# Patient Record
Sex: Male | Born: 1955 | Race: White | Hispanic: No | Marital: Single | Smoking: Never smoker
Health system: Southern US, Community
[De-identification: ages and names within clinical notes are randomized; demographics above are authoritative.]

## PROBLEM LIST (undated history)

## (undated) HISTORY — PX: KIDNEY SURGERY: SHX687

---

## 2014-07-12 ENCOUNTER — Encounter (HOSPITAL_COMMUNITY): Payer: Self-pay | Admitting: Emergency Medicine

## 2014-07-12 ENCOUNTER — Emergency Department (HOSPITAL_COMMUNITY)
Admission: EM | Admit: 2014-07-12 | Discharge: 2014-07-12 | Disposition: A | Discharge status: Home / Self Care | Payer: Self-pay | Attending: Emergency Medicine | Admitting: Emergency Medicine

## 2014-07-12 ENCOUNTER — Emergency Department (HOSPITAL_COMMUNITY): Payer: Self-pay

## 2014-07-12 DIAGNOSIS — X503XXA Overexertion from repetitive movements, initial encounter: Secondary | ICD-10-CM | POA: Insufficient documentation

## 2014-07-12 DIAGNOSIS — M1710 Unilateral primary osteoarthritis, unspecified knee: Secondary | ICD-10-CM | POA: Insufficient documentation

## 2014-07-12 DIAGNOSIS — S8990XA Unspecified injury of unspecified lower leg, initial encounter: Secondary | ICD-10-CM | POA: Insufficient documentation

## 2014-07-12 DIAGNOSIS — Y9289 Other specified places as the place of occurrence of the external cause: Secondary | ICD-10-CM | POA: Insufficient documentation

## 2014-07-12 DIAGNOSIS — S99929A Unspecified injury of unspecified foot, initial encounter: Principal | ICD-10-CM

## 2014-07-12 DIAGNOSIS — Y9389 Activity, other specified: Secondary | ICD-10-CM | POA: Insufficient documentation

## 2014-07-12 DIAGNOSIS — S99919A Unspecified injury of unspecified ankle, initial encounter: Principal | ICD-10-CM

## 2014-07-12 DIAGNOSIS — M1711 Unilateral primary osteoarthritis, right knee: Secondary | ICD-10-CM

## 2014-07-12 MED ORDER — PROMETHAZINE HCL 12.5 MG PO TABS
25.0000 mg | ORAL_TABLET | Freq: Once | ORAL | Status: AC
  Administered 2014-07-12: 25 mg via ORAL
  Filled 2014-07-12: qty 2

## 2014-07-12 MED ORDER — PREDNISONE 50 MG PO TABS: 60.0000 mg | ORAL_TABLET | Freq: Once | ORAL | Status: DC

## 2014-07-12 MED ORDER — DEXAMETHASONE 4 MG PO TABS: ORAL_TABLET | ORAL | Status: AC

## 2014-07-12 MED ORDER — PROMETHAZINE HCL 12.5 MG PO TABS: 12.5000 mg | ORAL_TABLET | Freq: Four times a day (QID) | ORAL | Status: AC | PRN

## 2014-07-12 MED ORDER — DICLOFENAC SODIUM 75 MG PO TBEC: 75.0000 mg | DELAYED_RELEASE_TABLET | Freq: Two times a day (BID) | ORAL | Status: AC

## 2014-07-12 MED ORDER — ONDANSETRON 4 MG PO TBDP: 4.0000 mg | ORAL_TABLET | Freq: Once | ORAL | Status: DC

## 2014-07-12 MED ORDER — KETOROLAC TROMETHAMINE 60 MG/2ML IM SOLN
60.0000 mg | Freq: Once | INTRAMUSCULAR | Status: AC
  Administered 2014-07-12: 60 mg via INTRAMUSCULAR
  Filled 2014-07-12: qty 2

## 2014-07-12 MED ORDER — KETOROLAC TROMETHAMINE 10 MG PO TABS: 10.0000 mg | ORAL_TABLET | Freq: Once | ORAL | Status: DC

## 2014-07-12 MED ORDER — DEXAMETHASONE SODIUM PHOSPHATE 4 MG/ML IJ SOLN
8.0000 mg | Freq: Once | INTRAMUSCULAR | Status: AC
  Administered 2014-07-12: 8 mg via INTRAMUSCULAR
  Filled 2014-07-12: qty 2

## 2014-07-12 NOTE — ED Notes (Signed)
Patient with no complaints at this time. Respirations even and unlabored. Skin warm/dry. Discharge instructions reviewed with patient at this time. Patient given opportunity to voice concerns/ask questions. Patient discharged at this time and left Emergency Department with steady gait.   

## 2014-07-12 NOTE — ED Notes (Signed)
Pt states that he was lifting a push lawnmower to show his father something with the lawnmower when his right knee twisted a week ago,  pt has swelling to right knee area, cms intact distal, has tried aleve, tylenol and ibuprofen with no improvement in symptoms,

## 2014-07-12 NOTE — ED Provider Notes (Signed)
CSN: [id number]     Arrival date & time 07/12/14  1650 History   First MD Initiated Contact with Patient 07/12/14 1700     Chief Complaint  Patient presents with  . Knee Pain     (Consider location/radiation/quality/duration/timing/severity/associated sxs/prior Treatment) HPI Comments: Patient is a 58 year old male who presents to the emergency department with a complaint of right knee pain. The patient states that a week ago he was lifting a push lawnmower, when his weight shifted and he injured the right knee. The patient noted swelling and pain. He did not go to see a Dr. at that time. He use conservative measures including ice, heat, Tylenol, Aleve, and ibuprofen. The patient states that this problem is not getting any better. He has restarted work, where he is walking on concrete floors and using sterile toe boots on. He presents at this time for assistance with pain of his knee, and to have his knee evaluated.  Patient is a 58 y.o. male presenting with knee pain. The history is provided by the patient.  Knee Pain Associated symptoms: no back pain and no neck pain     History reviewed. No pertinent past medical history. Past Surgical History  Procedure Laterality Date  . 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 History  Substance Use Topics  . Smoking status: Never Smoker   . Smokeless tobacco: Not on file  . Alcohol Use: No    Review of Systems  Constitutional: Negative for activity change.       All ROS Neg except as noted in HPI  HENT: Negative for nosebleeds.   Eyes: Negative for photophobia and discharge.  Respiratory: Negative for cough, shortness of breath and wheezing.   Cardiovascular: Negative for chest pain and palpitations.  Gastrointestinal: Negative for abdominal pain and blood in stool.  Genitourinary: Negative for dysuria, frequency and hematuria.  Musculoskeletal: Positive for arthralgias. Negative for back pain  and neck pain.  Skin: Negative.   Neurological: Negative for dizziness, seizures and speech difficulty.  Psychiatric/Behavioral: Negative for hallucinations and confusion.      Allergies  Review of patient's allergies indicates no known allergies.  Home Medications   Prior to Admission medications   Not on File   BP 144/98  Pulse 85  Temp(Src) 98.9 F (37.2 C)  Resp 16  Ht 6\' 1"  (1.854 m)  Wt 238 lb (107.956 kg)  BMI 31.41 kg/m2  SpO2 98% Physical Exam  Nursing note and vitals reviewed. Constitutional: He is oriented to person, place, and time. He appears well-developed and well-nourished.  Non-toxic appearance.  HENT:  Head: Normocephalic.  Right Ear: Tympanic membrane and external ear normal.  Left Ear: Tympanic membrane and external ear normal.  Eyes: EOM and lids are normal. Pupils are equal, round, and reactive to light.  Neck: Normal range of motion. Neck supple. Carotid bruit is not present.  Cardiovascular: Normal rate, regular rhythm, normal heart sounds, intact distal pulses and normal pulses.   Pulmonary/Chest: Breath sounds normal. No respiratory distress.  Abdominal: Soft. Bowel sounds are normal. There is no tenderness. There is no guarding.  Musculoskeletal: Normal range of motion.  There is good range of motion of the right hip. There is a mild to moderate effusion of the right knee. There is pain to palpation of the posterior knee, I cannot demonstrate a mass. There is no deformity of the quadricep or the anterior tibial tuberosity.  There is crepitus with attempted range of motion. The dorsalis pedis and posterior tibial pulses are 2+. Capillary refill is less than 2 seconds.  Lymphadenopathy:       Head (right side): No submandibular adenopathy present.       Head (left side): No submandibular adenopathy present.    He has no cervical adenopathy.  Neurological: He is alert and oriented to person, place, and time. He has normal strength. No cranial nerve  deficit or sensory deficit.  Skin: Skin is warm and dry.  Psychiatric: He has a normal mood and affect. His speech is normal.    ED Course  Procedures (including critical care time) Labs Review Labs Reviewed - No data to display  Imaging Review No results found.   EKG Interpretation None      MDM Patient presents to the emergency department with complaint of knee pain after lifting a push lawnmower. Differential diagnosis at this time includes ligament injury, occult fracture, knee strain/sprain, dislocation, exacerbation of previous degenerative joint disease changes.  Vital signs within normal limits with exception of the blood pressure being elevated at 144/98. Pulse oximetry is 98% on room air. Within normal limits by my interpretation. X-ray of the right knee reveals mild to moderate tricompartment degenerative changes with chondrocalcinosis. No fractures appreciated. No dislocation noted.  Patient is fitted with a knee immobilizer. Crutches were offered but declined. The patient is placed on Decadron, Voltaren, and promethazine, as he has problems with nausea when taking NSAIDs. The patient is referred to orthopedics, and strongly encouraged to see the orthopedic specialist for additional evaluation and management of his knees. Patient acknowledges understanding of this discharge instructions.    Final diagnoses:  None    **I have reviewed nursing notes, vital signs, and all appropriate lab and imaging results for this patient.Kathie Dike*    Leeanne Butters M Tashan Kreitzer, PA-C 07/15/14 [phone]

## 2014-07-12 NOTE — Discharge Instructions (Signed)
Your x-ray is negative for fracture or dislocation. Your x-ray does show SEVERE degenerative changes (arthritis) of your knee. You have fluid also on your knee as a result of the arthritis. There is some question of a ligament strain present on your examination. Please use the knee immobilizer over the next 7-10 days. Please see an orthopedic specialist as sone as possible. Please use medications as suggested. Promethazine for the nausea may cause drowsiness, please use with caution. Arthritis, Nonspecific Arthritis is pain, redness, warmth, or puffiness (inflammation) of a joint. The joint may be stiff or hurt when you move it. One or more joints may be affected. There are many types of arthritis. Your doctor may not know what type you have right away. The most common cause of arthritis is wear and tear on the joint (osteoarthritis). HOME CARE   Only take medicine as told by your doctor.  Rest the joint as much as possible.  Raise (elevate) your joint if it is puffy.  Use crutches if the painful joint is in your leg.  Drink enough fluids to keep your pee (urine) clear or pale yellow.  Follow your doctor's diet instructions.  Use cold packs for very bad joint pain for 10 to 15 minutes every hour. Ask your doctor if it is okay for you to use hot packs.  Exercise as told by your doctor.  Take a warm shower if you have stiffness in the morning.  Move your sore joints throughout the day. GET HELP RIGHT AWAY IF:   You have a fever.  You have very bad joint pain, puffiness, or redness.  You have many joints that are painful and puffy.  You are not getting better with treatment.  You have very bad back pain or leg weakness.  You cannot control when you poop (bowel movement) or pee (urinate).  You do not feel better in 24 hours or are getting worse.  You are having side effects from your medicine. MAKE SURE YOU:   Understand these instructions.  Will watch your condition.  Will  get help right away if you are not doing well or get worse. Document Released: 02/15/2010 Document Revised: 05/22/2012 Document Reviewed: 02/15/2010 [facility] Patient Information 2015 [place], [place]. This information is not intended to replace advice given to you by your health care provider. Make sure you discuss any questions you have with your health care provider.

## 2014-07-17 NOTE — ED Provider Notes (Signed)
Medical screening examination/treatment/procedure(s) were performed by non-physician practitioner and as supervising physician I was immediately available for consultation/collaboration.   EKG Interpretation None        Jeremi Losito, MD 07/17/14 0403 

## 2014-12-20 IMAGING — CR DG KNEE COMPLETE 4+V*R*
4 series · 4 of 4 positions shown · non-contrast
Comparison: None.

CLINICAL DATA: Knee pain status post twisting injury

EXAM:
RIGHT KNEE - COMPLETE 4+ VIEW

[view not recorded (1 of 4)]
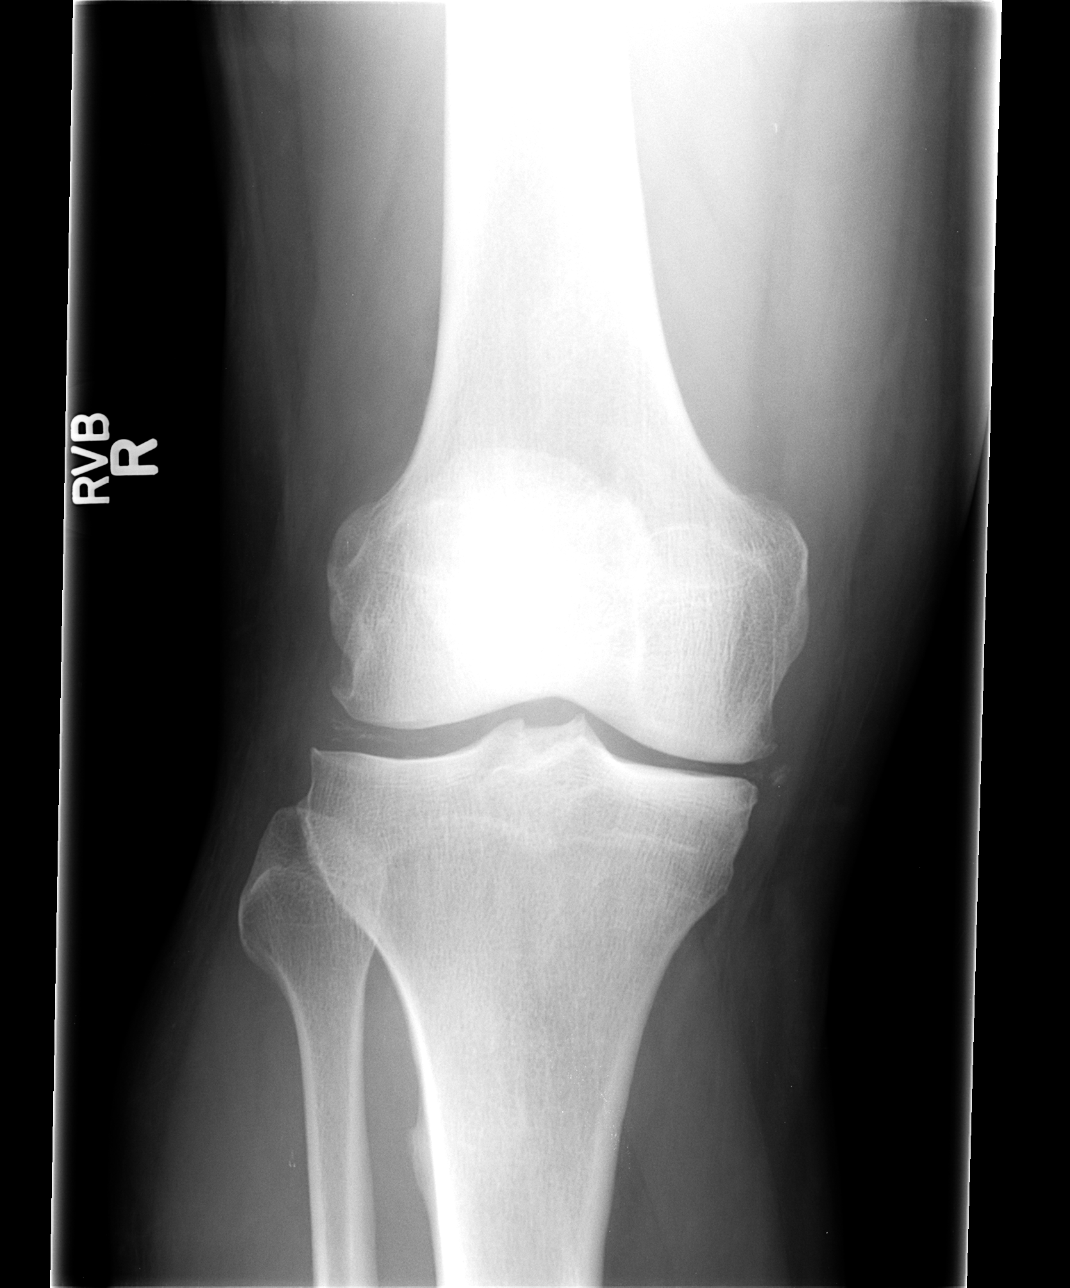

[view not recorded (2 of 4)]
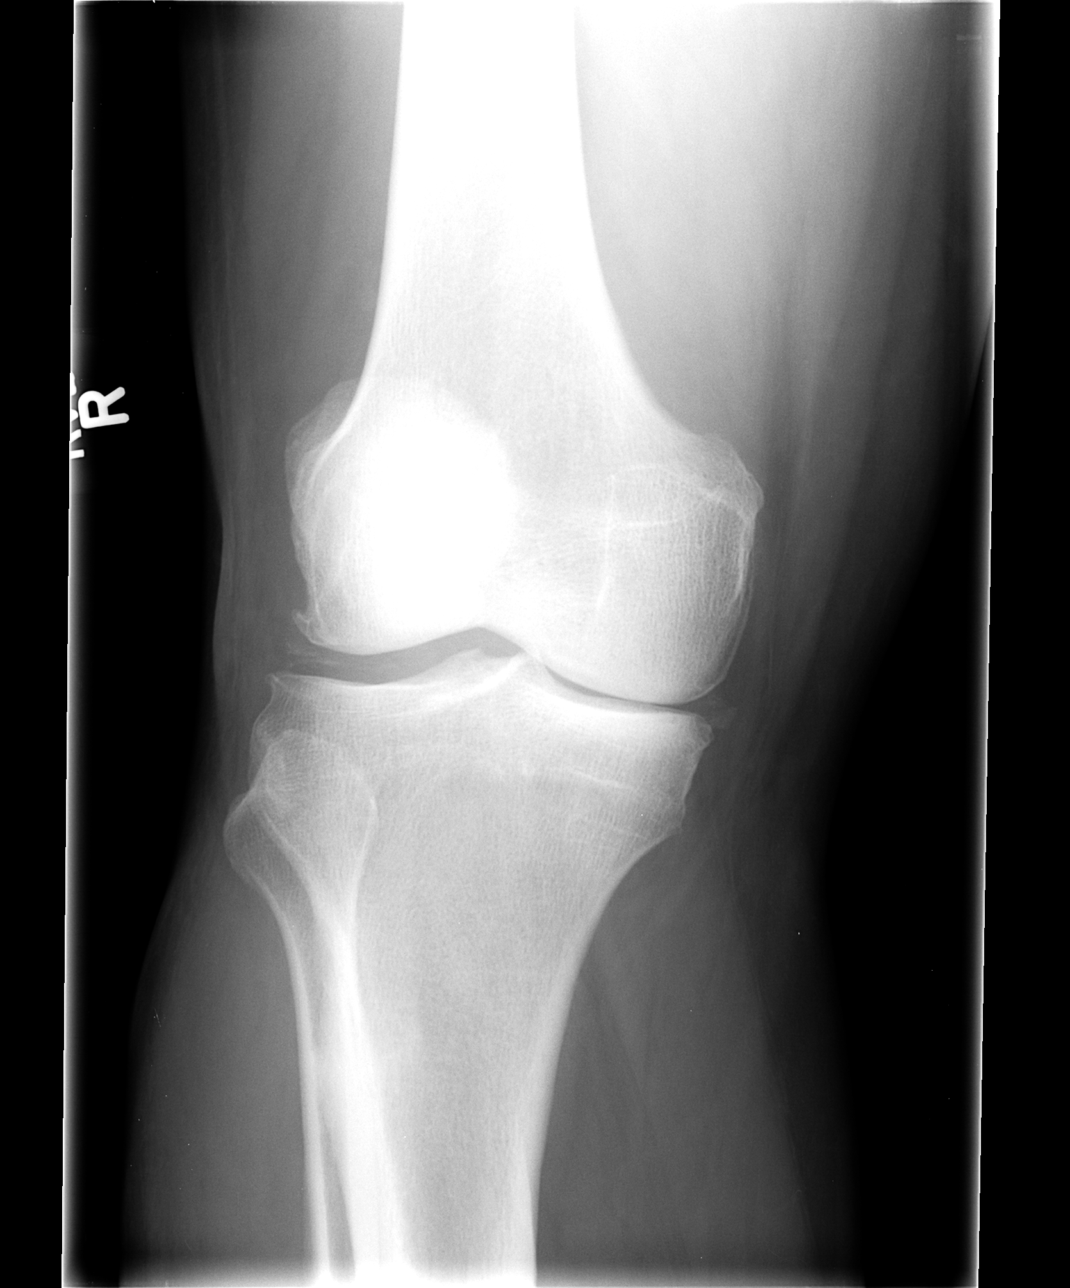

[view not recorded (3 of 4)]
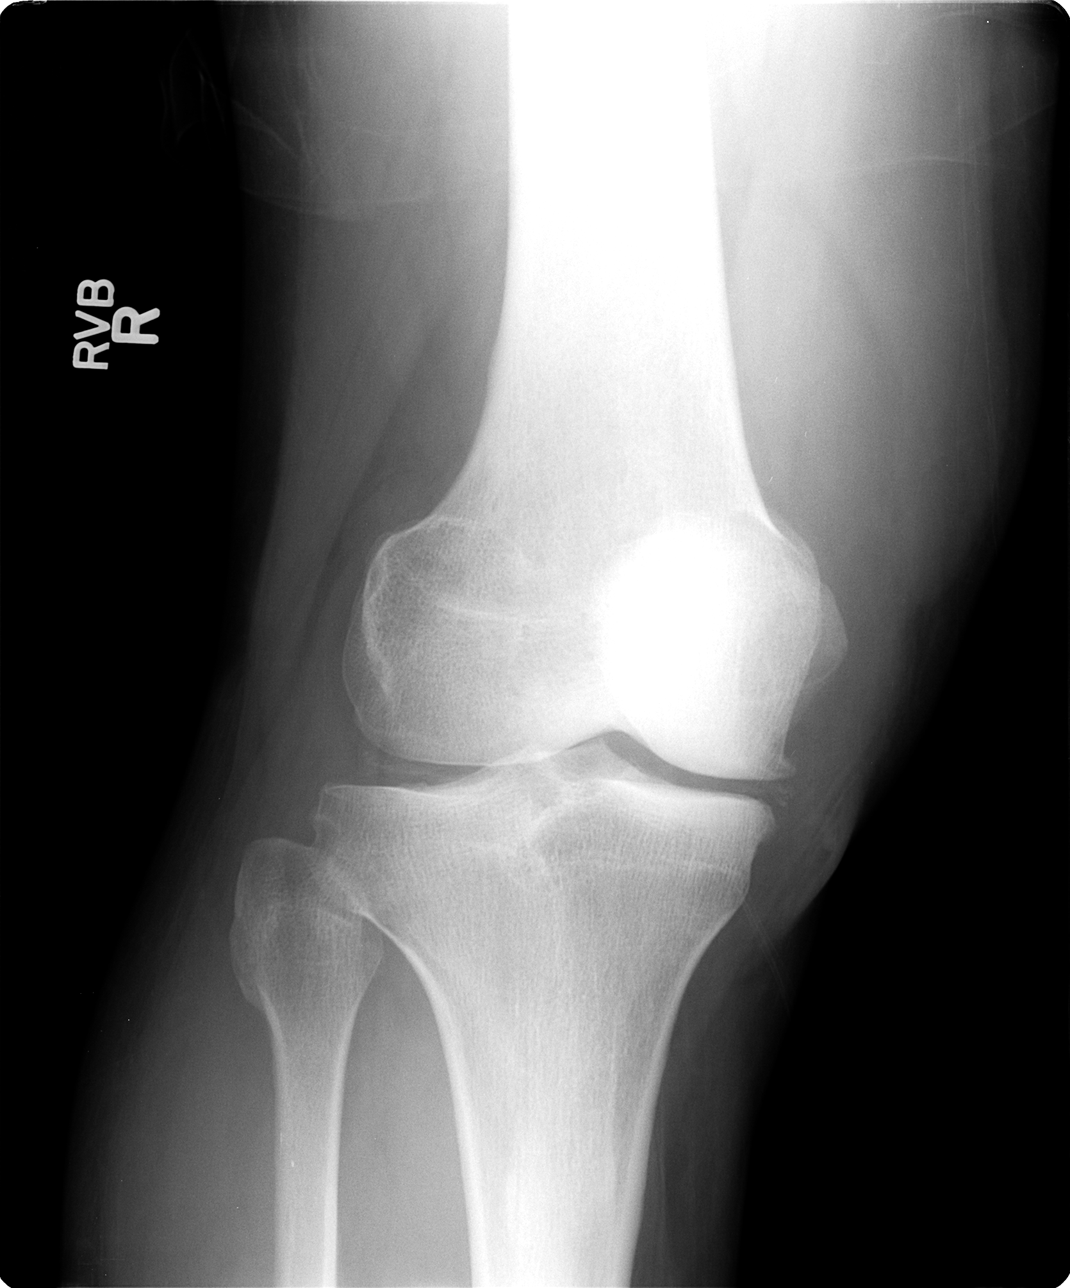

[view not recorded (4 of 4)]
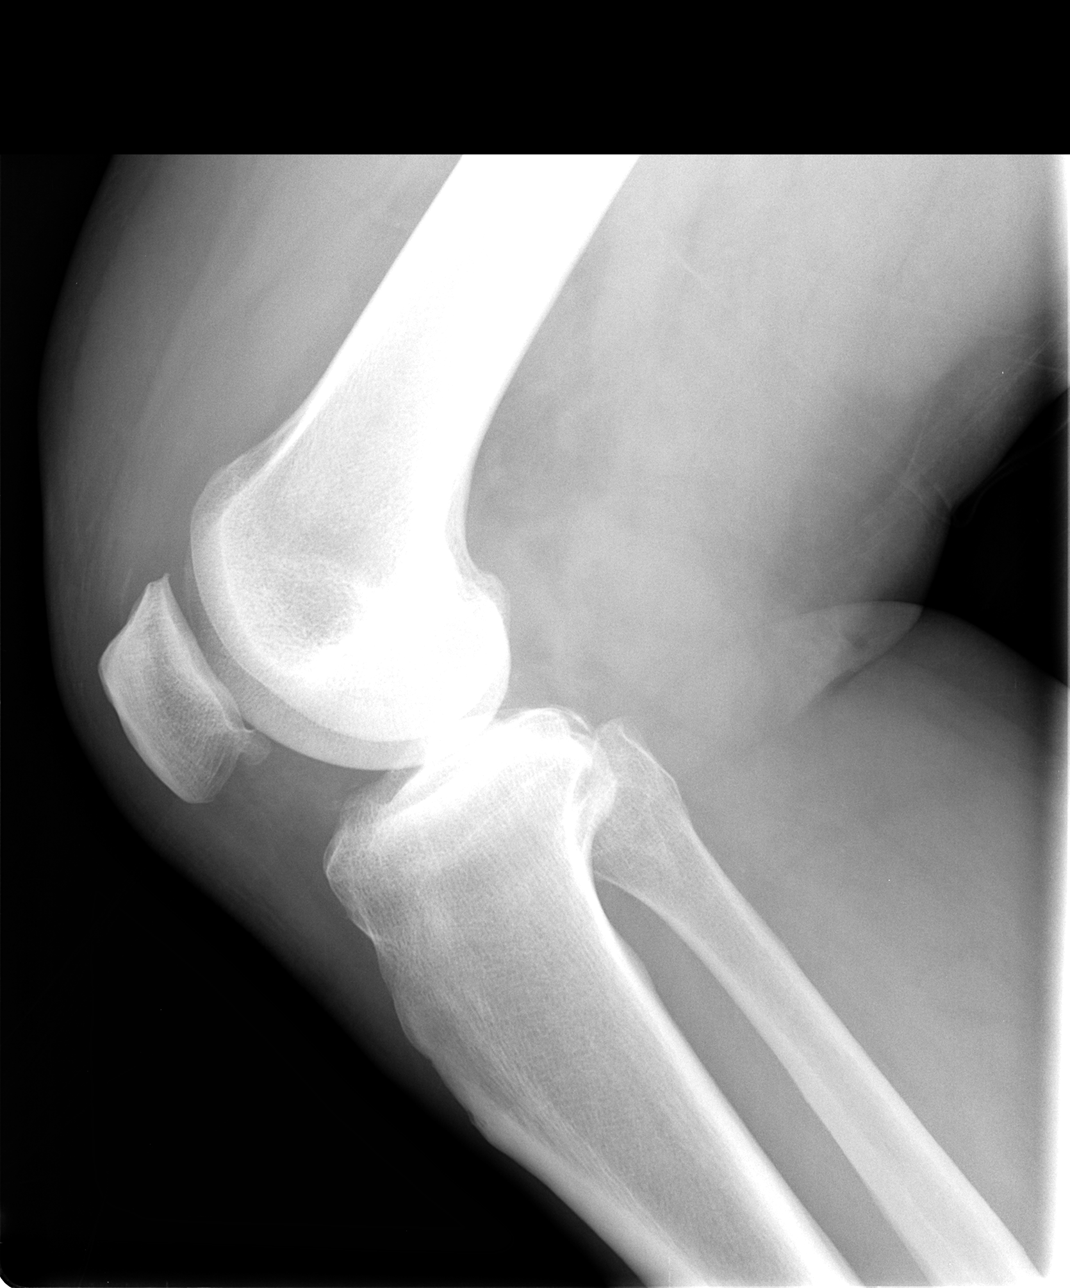

[4 of 4 positions shown; findings below may reference images not displayed]

FINDINGS: No fracture or dislocation is seen.

Mild to moderate tricompartmental degenerative changes with
chondrocalcinosis.

Moderate suprapatellar knee joint effusion.
IMPRESSION: No fracture or dislocation is seen.

Mild to moderate degenerative changes with chondrocalcinosis.

Moderate suprapatellar knee joint effusion.
